# Patient Record
Sex: Male | Born: 1945 | Race: Black or African American | Hispanic: No | Marital: Single
Health system: Southern US, Community
[De-identification: ages and names within clinical notes are randomized; demographics above are authoritative.]

---

## 2012-02-05 ENCOUNTER — Inpatient Hospital Stay: Payer: Self-pay | Admitting: Internal Medicine

## 2012-02-05 LAB — BASIC METABOLIC PANEL WITH GFR
Anion Gap: 14
BUN: 55 mg/dL — ABNORMAL HIGH
Calcium, Total: 8.7 mg/dL
Chloride: 113 mmol/L — ABNORMAL HIGH
Co2: 15 mmol/L — ABNORMAL LOW
Creatinine: 6.55 mg/dL — ABNORMAL HIGH
EGFR (African American): 9 — ABNORMAL LOW
EGFR (Non-African Amer.): 8 — ABNORMAL LOW
Glucose: 106 mg/dL — ABNORMAL HIGH
Osmolality: 299
Potassium: 5 mmol/L
Sodium: 142 mmol/L

## 2012-02-05 LAB — URINALYSIS, COMPLETE
Bacteria: NONE SEEN
Bilirubin,UR: NEGATIVE
Glucose,UR: NEGATIVE mg/dL
Ketone: NEGATIVE
Leukocyte Esterase: NEGATIVE
Nitrite: NEGATIVE
Ph: 5
Protein: 100
RBC,UR: NONE SEEN /HPF
Specific Gravity: 1.009
Squamous Epithelial: <1
WBC UR: 1 /HPF

## 2012-02-05 LAB — CBC
HCT: 35 % — ABNORMAL LOW (ref 40.0–52.0)
HGB: 10.9 g/dL — ABNORMAL LOW (ref 13.0–18.0)
MCH: 29.6 pg (ref 26.0–34.0)
MCHC: 31.1 g/dL — ABNORMAL LOW (ref 32.0–36.0)
RDW: 13.9 % (ref 11.5–14.5)
WBC: 8 10*3/uL (ref 3.8–10.6)

## 2012-02-05 LAB — PHOSPHORUS: Phosphorus: 4.1 mg/dL (ref 2.5–4.9)

## 2012-02-05 LAB — HEMOGLOBIN A1C: Hemoglobin A1C: 4.6 %

## 2012-02-06 LAB — BASIC METABOLIC PANEL
Anion Gap: 9 (ref 7–16)
BUN: 51 mg/dL — ABNORMAL HIGH (ref 7–18)
Co2: 20 mmol/L — ABNORMAL LOW (ref 21–32)
Creatinine: 5.52 mg/dL — ABNORMAL HIGH (ref 0.60–1.30)
EGFR (African American): 12 — ABNORMAL LOW
EGFR (Non-African Amer.): 10 — ABNORMAL LOW
Potassium: 5.5 mmol/L — ABNORMAL HIGH (ref 3.5–5.1)
Sodium: 143 mmol/L (ref 136–145)

## 2012-02-06 LAB — CBC WITH DIFFERENTIAL/PLATELET
Eosinophil %: 2.1 %
HCT: 32.2 % — ABNORMAL LOW (ref 40.0–52.0)
HGB: 10.7 g/dL — ABNORMAL LOW (ref 13.0–18.0)
MCV: 92 fL (ref 80–100)
Monocyte %: 10.1 %
Neutrophil #: 4.2 10*3/uL (ref 1.4–6.5)
Neutrophil %: 52.8 %
Platelet: 245 10*3/uL (ref 150–440)
RBC: 3.49 10*6/uL — ABNORMAL LOW (ref 4.40–5.90)
WBC: 7.9 10*3/uL (ref 3.8–10.6)

## 2012-02-06 LAB — PHOSPHORUS: Phosphorus: 3.3 mg/dL (ref 2.5–4.9)

## 2012-02-07 LAB — BASIC METABOLIC PANEL
Anion Gap: 6 — ABNORMAL LOW (ref 7–16)
BUN: 43 mg/dL — ABNORMAL HIGH (ref 7–18)
Chloride: 110 mmol/L — ABNORMAL HIGH (ref 98–107)
Creatinine: 4.95 mg/dL — ABNORMAL HIGH (ref 0.60–1.30)
EGFR (African American): 13 — ABNORMAL LOW
Sodium: 141 mmol/L (ref 136–145)

## 2012-02-07 LAB — PHOSPHORUS: Phosphorus: 3.5 mg/dL (ref 2.5–4.9)

## 2012-02-09 LAB — RENAL FUNCTION PANEL
Albumin: 3.2 g/dL — ABNORMAL LOW (ref 3.4–5.0)
BUN: 43 mg/dL — ABNORMAL HIGH (ref 7–18)
EGFR (African American): 11 — ABNORMAL LOW
EGFR (Non-African Amer.): 9 — ABNORMAL LOW
Glucose: 107 mg/dL — ABNORMAL HIGH (ref 65–99)
Osmolality: 283 (ref 275–301)
Phosphorus: 4.5 mg/dL (ref 2.5–4.9)

## 2012-02-09 LAB — ALBUMIN: Albumin: 3.3 g/dL — ABNORMAL LOW (ref 3.4–5.0)

## 2012-02-10 LAB — BASIC METABOLIC PANEL
BUN: 35 mg/dL — ABNORMAL HIGH (ref 7–18)
Calcium, Total: 8.6 mg/dL (ref 8.5–10.1)
Chloride: 106 mmol/L (ref 98–107)
Co2: 27 mmol/L (ref 21–32)
Glucose: 92 mg/dL (ref 65–99)
Osmolality: 291 (ref 275–301)
Potassium: 4.7 mmol/L (ref 3.5–5.1)
Sodium: 142 mmol/L (ref 136–145)

## 2012-02-10 LAB — KAPPA/LAMBDA FREE LIGHT CHAINS (ARMC)

## 2012-02-10 LAB — PLATELET COUNT: Platelet: 191 10*3/uL (ref 150–440)

## 2012-03-07 ENCOUNTER — Ambulatory Visit: Payer: Self-pay | Admitting: Vascular Surgery

## 2012-03-27 ENCOUNTER — Emergency Department: Payer: Self-pay | Admitting: Emergency Medicine

## 2012-03-27 LAB — CBC
MCV: 90 fL (ref 80–100)
Platelet: 314 10*3/uL (ref 150–440)
RBC: 3.57 10*6/uL — ABNORMAL LOW (ref 4.40–5.90)
WBC: 9.6 10*3/uL (ref 3.8–10.6)

## 2012-03-27 LAB — CK TOTAL AND CKMB (NOT AT ARMC)
CK, Total: 174 U/L (ref 35–232)
CK-MB: 3 ng/mL (ref 0.5–3.6)

## 2012-03-27 LAB — BASIC METABOLIC PANEL
Anion Gap: 7 (ref 7–16)
BUN: 22 mg/dL — ABNORMAL HIGH (ref 7–18)
Chloride: 105 mmol/L (ref 98–107)
Co2: 25 mmol/L (ref 21–32)
Creatinine: 4.46 mg/dL — ABNORMAL HIGH (ref 0.60–1.30)
Glucose: 90 mg/dL (ref 65–99)
Osmolality: 277 (ref 275–301)
Potassium: 4.6 mmol/L (ref 3.5–5.1)
Sodium: 137 mmol/L (ref 136–145)

## 2012-03-27 LAB — TROPONIN I: Troponin-I: <0.02 ng/mL

## 2012-04-03 ENCOUNTER — Emergency Department: Payer: Self-pay | Admitting: Emergency Medicine

## 2012-04-03 LAB — URINALYSIS, COMPLETE
Glucose,UR: NEGATIVE mg/dL (ref 0–75)
Ketone: NEGATIVE
Squamous Epithelial: <1

## 2012-04-04 LAB — COMPREHENSIVE METABOLIC PANEL
BUN: 43 mg/dL — ABNORMAL HIGH (ref 7–18)
Calcium, Total: 8.3 mg/dL — ABNORMAL LOW (ref 8.5–10.1)
Chloride: 110 mmol/L — ABNORMAL HIGH (ref 98–107)
EGFR (African American): 8 — ABNORMAL LOW
EGFR (Non-African Amer.): 7 — ABNORMAL LOW
Glucose: 129 mg/dL — ABNORMAL HIGH (ref 65–99)
Osmolality: 292 (ref 275–301)
SGOT(AST): 27 U/L (ref 15–37)
SGPT (ALT): 25 U/L (ref 12–78)
Sodium: 140 mmol/L (ref 136–145)
Total Protein: 8.2 g/dL (ref 6.4–8.2)

## 2012-04-04 LAB — CBC
HCT: 33.5 % — ABNORMAL LOW (ref 40.0–52.0)
HGB: 10.6 g/dL — ABNORMAL LOW (ref 13.0–18.0)
MCH: 29.2 pg (ref 26.0–34.0)
MCHC: 31.7 g/dL — ABNORMAL LOW (ref 32.0–36.0)
RDW: 14.2 % (ref 11.5–14.5)

## 2012-04-04 LAB — ETHANOL
Ethanol %: 0.092 % — ABNORMAL HIGH (ref 0.000–0.080)
Ethanol: 92 mg/dL

## 2012-10-11 ENCOUNTER — Inpatient Hospital Stay: Payer: Self-pay | Admitting: Internal Medicine

## 2012-10-11 LAB — URINALYSIS, COMPLETE
Bilirubin,UR: NEGATIVE
Glucose,UR: NEGATIVE mg/dL (ref 0–75)
Ketone: NEGATIVE
Nitrite: NEGATIVE
Ph: 7 (ref 4.5–8.0)
Protein: 100
RBC,UR: <1 /HPF (ref 0–5)
Specific Gravity: 1.011 (ref 1.003–1.030)
Squamous Epithelial: <1

## 2012-10-11 LAB — COMPREHENSIVE METABOLIC PANEL
Anion Gap: 9 (ref 7–16)
BUN: 85 mg/dL — ABNORMAL HIGH (ref 7–18)
Bilirubin,Total: 0.3 mg/dL (ref 0.2–1.0)
Calcium, Total: 9.4 mg/dL (ref 8.5–10.1)
Creatinine: 8.32 mg/dL — ABNORMAL HIGH (ref 0.60–1.30)
EGFR (African American): 7 — ABNORMAL LOW
EGFR (Non-African Amer.): 6 — ABNORMAL LOW
Glucose: 81 mg/dL (ref 65–99)
Osmolality: 302 (ref 275–301)
SGOT(AST): 16 U/L (ref 15–37)
SGPT (ALT): 21 U/L (ref 12–78)
Sodium: 139 mmol/L (ref 136–145)
Total Protein: 8.9 g/dL — ABNORMAL HIGH (ref 6.4–8.2)

## 2012-10-11 LAB — POTASSIUM
Potassium: 6.8 mmol/L (ref 3.5–5.1)
Potassium: 4.8 mmol/L (ref 3.5–5.1)

## 2012-10-11 LAB — CBC
HCT: 38.2 % — ABNORMAL LOW (ref 40.0–52.0)
HGB: 12.6 g/dL — ABNORMAL LOW (ref 13.0–18.0)
MCHC: 32.9 g/dL (ref 32.0–36.0)
Platelet: 351 10*3/uL (ref 150–440)
RDW: 13.7 % (ref 11.5–14.5)
WBC: 8.4 10*3/uL (ref 3.8–10.6)

## 2012-10-11 LAB — PHOSPHORUS: Phosphorus: 3.2 mg/dL (ref 2.5–4.9)

## 2012-10-11 LAB — CK TOTAL AND CKMB (NOT AT ARMC)
CK, Total: 83 U/L (ref 35–232)
CK-MB: 2.6 ng/mL (ref 0.5–3.6)

## 2012-10-12 ENCOUNTER — Observation Stay: Payer: Self-pay | Admitting: Internal Medicine

## 2012-10-12 LAB — CBC WITH DIFFERENTIAL/PLATELET
Basophil #: 0 10*3/uL (ref 0.0–0.1)
Basophil %: 0.4 %
Eosinophil #: 0.2 10*3/uL (ref 0.0–0.7)
HCT: 38.4 % — ABNORMAL LOW (ref 40.0–52.0)
Lymphocyte %: 30.8 %
MCH: 30.3 pg (ref 26.0–34.0)
MCHC: 33.5 g/dL (ref 32.0–36.0)
MCV: 90 fL (ref 80–100)
Monocyte #: 0.9 x10 3/mm (ref 0.2–1.0)
Monocyte %: 9 %
Neutrophil %: 57.5 %
RBC: 4.26 10*6/uL — ABNORMAL LOW (ref 4.40–5.90)
RDW: 13.5 % (ref 11.5–14.5)

## 2012-10-12 LAB — COMPREHENSIVE METABOLIC PANEL
Alkaline Phosphatase: 143 U/L — ABNORMAL HIGH (ref 50–136)
Anion Gap: 7 (ref 7–16)
BUN: 35 mg/dL — ABNORMAL HIGH (ref 7–18)
Bilirubin,Total: 0.5 mg/dL (ref 0.2–1.0)
Co2: 30 mmol/L (ref 21–32)
Creatinine: 5.45 mg/dL — ABNORMAL HIGH (ref 0.60–1.30)
Osmolality: 283 (ref 275–301)
Potassium: 4 mmol/L (ref 3.5–5.1)
SGOT(AST): 29 U/L (ref 15–37)
Sodium: 134 mmol/L — ABNORMAL LOW (ref 136–145)
Total Protein: 8.9 g/dL — ABNORMAL HIGH (ref 6.4–8.2)

## 2012-10-12 LAB — BASIC METABOLIC PANEL
Anion Gap: 10 (ref 7–16)
Calcium, Total: 8.5 mg/dL (ref 8.5–10.1)
Chloride: 103 mmol/L (ref 98–107)
Co2: 24 mmol/L (ref 21–32)
Creatinine: 6.84 mg/dL — ABNORMAL HIGH (ref 0.60–1.30)
EGFR (African American): 9 — ABNORMAL LOW
Osmolality: 290 (ref 275–301)
Potassium: 5.3 mmol/L — ABNORMAL HIGH (ref 3.5–5.1)

## 2012-10-12 LAB — CBC
HGB: 12.7 g/dL — ABNORMAL LOW (ref 13.0–18.0)
MCH: 29.8 pg (ref 26.0–34.0)
MCHC: 33.4 g/dL (ref 32.0–36.0)
MCV: 89 fL (ref 80–100)
RBC: 4.25 10*6/uL — ABNORMAL LOW (ref 4.40–5.90)
WBC: 10.5 10*3/uL (ref 3.8–10.6)

## 2012-10-12 LAB — TROPONIN I: Troponin-I: 0.02 ng/mL

## 2012-10-12 LAB — PHOSPHORUS: Phosphorus: 4.9 mg/dL (ref 2.5–4.9)

## 2012-10-13 LAB — BASIC METABOLIC PANEL
BUN: 38 mg/dL — ABNORMAL HIGH (ref 7–18)
Calcium, Total: 8.4 mg/dL — ABNORMAL LOW (ref 8.5–10.1)
Co2: 29 mmol/L (ref 21–32)
EGFR (African American): 11 — ABNORMAL LOW
EGFR (Non-African Amer.): 9 — ABNORMAL LOW
Glucose: 127 mg/dL — ABNORMAL HIGH (ref 65–99)
Osmolality: 286 (ref 275–301)
Potassium: 4.6 mmol/L (ref 3.5–5.1)
Sodium: 138 mmol/L (ref 136–145)

## 2012-10-13 LAB — CK-MB
CK-MB: 3.3 ng/mL (ref 0.5–3.6)
CK-MB: 3.4 ng/mL (ref 0.5–3.6)
CK-MB: 3.2 ng/mL (ref 0.5–3.6)

## 2012-10-13 LAB — TROPONIN I
Troponin-I: 0.04 ng/mL
Troponin-I: 0.03 ng/mL

## 2012-10-13 LAB — CK
CK, Total: 170 U/L (ref 35–232)
CK, Total: 186 U/L (ref 35–232)
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
## 2014-07-23 NOTE — Op Note (Signed)
PATIENT NAME:  Dakota Robertson, Dakota Robertson MR#:  [id number] DATE OF BIRTH:  01/27/1946  DATE OF PROCEDURE:  02/05/2012  PREOPERATIVE DIAGNOSIS: Chronic kidney disease with progression and need for dialysis today.   POSTOPERATIVE DIAGNOSIS: Chronic kidney disease with progression and need for dialysis today.       PROCEDURES:    1. Ultrasound guidance for vascular access, right femoral vein.  2. Placement of right femoral venous dialysis catheter.   SURGEON: Annice NeedyJason S. Dew, M.D.   ANESTHESIA: Local.   ESTIMATED BLOOD LOSS: Minimal.   INDICATION FOR PROCEDURE: This is a 69 year old male with recent diagnosis of severe chronic kidney disease. He is admitted with progression and need for dialysis at this time.   DESCRIPTION OF PROCEDURE: The patient's groin was sterilely prepped and draped, and a sterile surgical created. The right femoral vein was visualized with ultrasound and found to be widely patent. It was then accessed under direct ultrasound guidance without difficulty. After the femoral vein was accessed with a Seldinger needle under direct ultrasound guidance, a J-wire was placed. After skin nick and dilatation, the Trialysis type 30 cm long dialysis catheter was placed over the wire and the wire was removed. All three lumens withdrew dark red nonpulsatile blood and flushed easily with sterile saline. It was secured at the skin at 29 cm with three nylon sutures. A sterile dressing was placed.   ____________________________ Annice NeedyJason S. Dew, MD jsd:ap D: 02/05/2012 13:33:46 ET          T: 02/05/2012 14:14:06 ET JOB#: [id number] cc: Annice NeedyJason S. Dew, MD, <Dictator> Annice NeedyJASON S DEW MD ELECTRONICALLY SIGNED 02/07/2012 13:14

## 2014-07-23 NOTE — Consult Note (Signed)
Patient with CKD, appears to have progressed to ESRD but not entirely clear.  Does need HD today so will place a temporary catheter.  If does not recover over next couple of days and is clearly ESRD, will place Permcath early this week  Electronic Signatures: Annice Needyew, Jason S (MD)  (Signed on 02-Nov-13 12:45)  Authored  Last Updated: 02-Nov-13 12:45 by Annice Needyew, Jason S (MD)

## 2014-07-23 NOTE — Consult Note (Signed)
PATIENT NAME:  Dakota Robertson, Dakota Robertson MR#:  [id number] DATE OF BIRTH:  01/27/1946  DATE OF CONSULTATION:  02/08/2012  REFERRING PHYSICIAN:   CONSULTING PHYSICIAN:  Audery Amel, MD  IDENTIFYING INFORMATION AND REASON FOR CONSULT: This is a 69 year old man who was brought to the Emergency Room for a complaint of alcohol abuse, brought in by police with "need for medical clearance for placement in jail".  In the Emergency Room, the patient was discovered to be in worsening renal failure and in need of admission for dialysis. Consultation appears to have been triggered by agitated and uncooperative behavior at dialysis yesterday.   HISTORY OF PRESENT ILLNESS: Information obtained from the patient and the chart. His  chief complaint to me was, "It's the same damn thing- I was drinking too much." He tells me that he has been drinking about a pint of liquor a day and has been doing so for years. He currently resides at a group home and said that he has to sneak to do it and usually does it on his own. He admits that he does not enjoy it a great deal, but says that he does not think that it has been a significant problem. He tells me that this is actually the first time in his life that drinking had been a problem for him. He is not able to articulate for me specifics about his medical treatment here in the hospital. He tells me several times that he knows there is something wrong with his liver. He remembers having been agitated at dialysis yesterday. His explanation for it to me is that he was having pain in his groin and he does not think that the doctors were paying any attention to him. He does not remember any behavior problems in the Emergency Room. It was described in the admission note that he was agitated and required security management when he was brought into the Emergency Room. The patient's chart on a couple of occasions mentions him as having had a past history of a diagnosis of schizophrenia. There are  not any other details offered about that and he does not appear to be on any antipsychotic medicines. I asked him about this and he denied anyone ever having told him that he had schizophrenia or having any history of mental illness.   PAST PSYCHIATRIC HISTORY: The only information I have is from the patient. He says he has never been in a psychiatric hospital. Never been in any kind of detox facility. He says he has never had delirium tremens. Never had a seizure from alcohol withdrawal. He admits that he drinks a pint of liquor a day but minimizes the degree to which that has ever been a problem. Denies that he is abusing any other drugs. Denies ever having been treated with any other psychiatric medicine.   SOCIAL HISTORY: Evidently he is currently living in a group home locally. He says he has been there for about the last four months. Prior to that he is a little bit vague about his lifestyle and whereabouts. He tells me that he was placed in the group home from [facility] where he was treated earlier in the year. Prior to that he says that he moved around a lot and has not been able to work in a few years because of pain in his leg. He does not appear to have any family actively involved in his assistance right now.   He does apparently have outstanding warrants because  he was brought into the Emergency Room by the police with the concern that they wanted to have him cleared in order to go into jail.   PAST MEDICAL HISTORY: He has chronic kidney disease, hypertension, possible diabetes, history of alcohol abuse, chronic obstructive pulmonary disease, listed as possibly having schizophrenia but no details were given. On admission his medications were listed as being trazodone, tramadol, metoprolol, cetirizine, amlodipine and sodium bicarbonate.   FAMILY HISTORY: Family history of alcohol abuse.   REVIEW OF SYSTEMS: He complains of some pain in his groin around the dialysis catheter. Denies  feeling shaky. Denies feeling sick to his stomach. Denies feeling confused. Denies any acute mood symptoms. Denies any hostility or suicidal ideation.   MENTAL STATUS EXAM: The patient was awake and alert and cooperative throughout the interview. He made intermittent eye contact but part of that was because he was eating dinner while I was there. Once he was done he made pretty good eye contact. Psychomotor activity appeared to be appropriate. There was no evidence of tremor. There was no evidence of greatly confused or disorganized movements such as might be seen in a delirious person. His speech was normal rate, tone, and volume. His affect was a bit blunted but not bizarre. Mood was stated as being okay. Thoughts showed a little bit of slowness, but no sign of completely bizarre thinking. It is  possible he may be confabulating or simply being evasive in not mentioning his criminal charges or anything else about his history of alcohol abuse. He denies any suicidal or homicidal ideation. Denies any hallucinations, visual or auditory.  He did not otherwise show any signs of being psychotic. The patient clearly has an impaired memory. I did not do a full Mini-Mental Status exam with him, but he was not, for instance, able to tell me the correct year. Neither did he correct himself spontaneously. He told me several times that he was here for problems with his liver. He could not explain to me what the dialysis catheter was for. When I explained to him very simply that it had to do with his kidney disease and how dialysis basically worked and then asked him a minute later to repeat it, he once again told me about his liver and had no memory of our discussion of his kidneys.   ASSESSMENT: This is a 69 year old man who apparently was agitated in dialysis yesterday. Without seeing the episode myself it is not clear whether he was delirious or may have actually just been reacting to the pain and not been able to  articulate it well or what was going on. In my interview today he does not appear to be psychotic, but he does appear to have some degree of dementia. His dementia could certainly be explained by his alcohol abuse. I suspect that he is probably not being completely honest about the degree and problems that he has had related to his alcohol abuse. Right now, however, he does not appear to have delirium tremens. He is not shaky. He is not hemodynamically unstable in a manner typical of alcohol withdrawal.   TREATMENT PLAN: I tried to offer supportive and educational therapy with the patient. I tried  to do some exploration of his alcohol abuse problems. He is unwilling to admit that he has a significant problem and has no interest in going into an alcohol treatment program. At this point, I do not think he needs CIWA protocol or full medical management for alcohol withdrawal.  To manage the agitation, I suggest a small dose of benzodiazepine prior to dialysis. I put in an order for Xanax dose prior to his dialysis treatment tomorrow. I will continue to follow up while he is in the hospital. Fortunately, he is already living in a group home. I hope they will be willing to take him back, although it may be that he is going to jail anyway. Care management should probably be involved in assessing exactly what his legal and social situation is.   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   SECONDARY DIAGNOSES:  AXIS I: Alcohol dependence.   AXIS II: Deferred.   AXIS III: Renal failure, now getting hemodialysis, hypertension, alcohol abuse.   AXIS IV: Moderate from his minimal resources and possible legal problems.   AXIS V: Functioning at time of evaluation: 40.   ____________________________ Audery Amel, MD jtc:bjt D:  02/08/2012 22:23:39 ET          T: 02/09/2012 09:08:58 ET         JOB#: [id number]   Audery Amel MD ELECTRONICALLY SIGNED  02/09/2012 10:05

## 2014-07-23 NOTE — Op Note (Signed)
PATIENT NAME:  Dakota Robertson, Dakota Robertson MR#:  [id number] DATE OF BIRTH:  1946-03-01  DATE OF PROCEDURE:  02/09/2012  PREOPERATIVE DIAGNOSES:  1. Endstage renal disease.  2. Diabetes.  3. Hypertension.   POSTOPERATIVE DIAGNOSES:  1. Endstage renal disease.  2. Diabetes.  3. Hypertension.   PROCEDURES PERFORMED: 1. Ultrasound guidance for vascular access, right jugular vein.  2. Fluoroscopic guidance for placement of catheter. 3. Placement of 19 cm tip-to-cuff tunneled hemodialysis catheter.   SURGEON: Annice NeedyJason S. Dew, M.D.   ANESTHESIA: Local with moderate conscious sedation.   ESTIMATED BLOOD LOSS: Approximately 25 mL.  FLUOROSCOPY TIME: Less than 1 minute.  CONTRAST USED: None.  INDICATION FOR PROCEDURE: This is a 69 year old African American male with end-stage renal disease who now needs permanent dialysis access to be able to be discharged home with. He will require a fistula graft later as an outpatient.   DESCRIPTION OF PROCEDURE: The patient is brought to the vascular interventional radiology suite. The right neck and chest was sterilely prepped and draped and a sterile surgical field was created. The right jugular vein was visualized with ultrasound and found to be widely patent. It was then accessed under direct ultrasound guidance without difficulty with a Seldinger needle. J-wire was placed. After skin nick and dilatation, the peel-away sheath was placed over the wire and the wire and dilator were removed. I then anesthetized an area two finger-breaths below the right clavicle and tunneled from the subclavicular incision to the access site. Using fluoroscopic guidance, I selected a 19 cm tip-to-cuff tunneled hemodialysis catheter and tunneled this from the subclavicular incision to the access site, placed this through the peel-away sheath, and the peel-away sheath was removed. The catheter tip was located just into the right atrium, with the distal tip without kink. It withdrew blood well  and flushed easily with heparinized saline and concentrated heparin solution was placed. It was secured to the chest wall with two silk sutures. A 4-0 Monocryl pursestring suture was placed at the exit site and a 4-0 Monocryl pursestring suture was placed at the access site. Sterile dressing was placed. The patient tolerated the procedure well.  ____________________________ Annice NeedyJason S. Dew, MD jsd:slb D: 02/09/2012 17:29:20 ET T: 02/10/2012 10:12:38 ET JOB#: [id number]  cc: Annice NeedyJason S. Dew, MD, <Dictator> Annice NeedyJASON S DEW MD ELECTRONICALLY SIGNED 02/10/2012 18:12

## 2014-07-23 NOTE — Consult Note (Signed)
Details:    - Psychiatry: Saw patient prior to discharge. He was calm and appropriate. No sign of delirium. Understood the dialysis and was agreeable to further treatment. Counceling done with him about his alcohol use and how further use put him at elevated risk to die sooner and how important it was to remain sober. Patient states his understanding and agrees to stay off alcohol. Denies any suicidal ideation   Electronic Signatures: Audery Amellapacs, Nehan Flaum T (MD)  (Signed [phone]-Nov-13 15:04)  Authored: Details   Last Updated: 08-Nov-13 15:04 by Audery Amellapacs, Brittannie Tawney T (MD)

## 2014-07-23 NOTE — Discharge Summary (Signed)
PATIENT NAME:  Dakota Robertson, Dakota Robertson MR#:  [id number] DATE OF BIRTH:  03/01/1946  DATE OF ADMISSION:  02/05/2012 DATE OF DISCHARGE:  02/10/2012  PRIMARY CARE PHYSICIAN: [facility]  FINAL DIAGNOSES:  1. End-stage renal disease requiring dialysis.  2. Encephalopathy.  3. Hypertension.  4. Alcohol abuse.  5. Tobacco abuse.   MEDICATIONS ON DISCHARGE:  1. Amlodipine 10 mg half tablet daily. 2. Cetirizine 10 mg daily.  3. Metoprolol 25 mg twice a day. 4. Tramadol 50 mg every six hours as needed.  5. Trazodone 50 mg at bedtime.  6. Vitamin B 100 mg daily.  7. Acetaminophen 325 mg two tablets twice a day as needed for headache.  8. Nicotine patch 21 mg to chest wall, extended-release, one patch transdermally daily.   NOTE: Stop taking sodium bicarbonate.   DIET: Renal diet, diet consistency regular.  ACTIVITY: Activity as tolerated.   DISCHARGE FOLLOWUP: Followup with dialysis Tuesday, Thursday, and Saturday at 11:30 a.m. starting next Tuesday; [facility] is the [facility] on [address]. Followup in 1 to 2 weeks with your medical doctor.   REASON FOR ADMISSION: The patient was admitted 02/05/2012 and discharged 02/10/2012. The patient was brought in with altered mental status.   HISTORY OF PRESENT ILLNESS: The patient is a 69 year old man with history of alcohol abuse [facility] with two outstanding warrants and needed medical clearance prior to placement in the jail. He was initially refusing to answer questions in the Emergency Room. He was found to have a very elevated creatinine of 6.5 and a BUN of 55 and nephrology recommended dialysis. He was admitted to the hospital and started on dialysis. Dr. Wyn Quakerew placed a temporary catheter on 02/05/2012 and then placed a tunnelled catheter on 02/09/2012, in the right upper chest.   LABORATORY, DIAGNOSTIC AND RADIOLOGIC DATA: Ethanol level 111, glucose 106, BUN 55, creatinine 6.55, sodium 142,  potassium 5.0, chloride 113, CO2 15, and calcium 8.7.   White blood cell count 8.0, hemoglobin and hematocrit 10.9 and 35.0, and platelet count 265.  Urinalysis positive for 1+ blood, otherwise negative urinalysis.  CT scan of the head showed no acute intracranial abnormality, atrophy with chronic microvascular ischemic change.   EKG showed normal sinus rhythm, left axis deviation, incomplete right bundle branch block.   Hemoglobin A1c 4.6.   Parathyroid hormone 169.  Hepatitis B surface antigen negative.   Ultrasound of the kidneys showed increased echotexture in the renal parenchyma consistent with mild chronic renal disease; no mass or obstructions. No stones are evident. Prostate is diffusely enlarged.   A repeat chemistry was done after tunneled catheter on 02/09/2012, in the evening, and potassium came back at 7.7 and that required urgent dialysis. Potassium after dialysis was 4.5.   Hepatitis C antibody was negative. Free kappa/lambda - free kappa light chains elevated at 185.46, free lambda light chains elevated at 64.96. Kappa/lambda ratio elevated at 2.85.   Chemistry with dialysis on 02/10/2012 showed a potassium of 4.7, creatinine of 4.94, and platelet count of 191.   HOSPITAL COURSE PER PROBLEM LIST:  1. For the patient's end-stage renal disease requiring dialysis with hyperkalemia, the patient was started on dialysis. A temporary catheter was placed and that was removed and a right chest tunnelled catheter was placed by Dr. Wyn Quakerew. The patient will continue to follow up with dialysis as outpatient Tuesday, Thursday and Saturday, starting next Tuesday. This was okayed by Dr. Thedore MinsSingh. The patient will be able to miss  dialysis on Saturday; new starts are not started on Saturday.  2. Encephalopathy. The patient does get confused after waking up from naps. Psychiatry did see him when he was awake and alert and no medications were prescribed. The patient has had periods of confusion. He  was on CIWA protocol. During the hospitalization no signs of delirium tremens. I think the alcohol has affected him. He does have chronic microvascular disease on the CT scan of the head.  3. Hypertension. He is on amlodipine and metoprolol; blood pressure 125/80.  4. Alcohol abuse. I advised him to stop drinking alcohol. I think that this is his major issue.  5. Tobacco abuse. Smoking cessation counseling done, three minutes by me. Nicotine patch applied.   TIME SPENT ON DISCHARGE: 35 minutes.  ____________________________ Herschell Dimes. Renae Gloss, MD rjw:slb D: 02/10/2012 15:17:33 ET T: 02/11/2012 11:34:11 ET JOB#: [id number]  cc: Herschell Dimes. Renae Gloss, MD, <Dictator> Munsoor Lizabeth Leyden, MD Mosetta Pigeon, MD [facility] - [address] Salley Scarlet MD ELECTRONICALLY SIGNED 02/12/2012 20:20

## 2014-07-23 NOTE — Consult Note (Signed)
Details:    - Psychiatry: Came to see patient but he was off getting his perm-cath placed. will follow tomorrow.   Electronic Signatures: Audery Amellapacs, Tamikka Pilger T (MD)  (Signed [phone]-Nov-13 16:10)  Authored: Details   Last Updated: 07-Nov-13 16:10 by Audery Amellapacs, Thadd Apuzzo T (MD)

## 2014-07-23 NOTE — Consult Note (Signed)
Brief Consult Note: Diagnosis: alcohol abuse. dementia.   Patient was seen by consultant.   Consult note dictated.   Recommend to proceed with surgery or procedure.   Comments: Psychiatry: Patient seen. Was apparently agitated during dialysis. He admits he has been drinking about a pint of liquor a day recently. He has no history of DTs or seizures. He is currently calm and pleasant and cooperative and not delirious but does have some short term memory problems. Chart says"schizophrenia" but he denies it and does not have symptoms of schizophrenia. Would not change or add to treatment at this time. Will follow.  Electronic Signatures: Audery Amellapacs, John T (MD)  (Signed [phone]-Nov-13 17:58)  Authored: Brief Consult Note   Last Updated: 05-Nov-13 17:58 by Audery Amellapacs, John T (MD)

## 2014-07-23 NOTE — Op Note (Signed)
PATIENT NAME:  [name], [name] MR#:  Robertson DATE OF BIRTH:  [birth year]  DATE OF PROCEDURE:  03/07/2012  PREOPERATIVE DIAGNOSIS: Complication dialysis catheter with poor flows and inability to achieve dialysis.   POSTOPERATIVE DIAGNOSIS: Complication dialysis catheter with poor flows and inability to achieve dialysis.   PROCEDURE PERFORMED: TPA infusion for catheter clearance.   SURGEON: Renford DillsGregory G. Allyse Fregeau, MD   DESCRIPTION OF PROCEDURE: The patient is brought to Special Procedures holding area. 2 mg of TPA in 50 mL x2 is reconstituted. It is then infused to be at both lumens of the catheter simultaneously for approximately three hours. Following this, aspiration of both lumens by myself demonstrated excellent blood flow. Both lumens were flushed with sterile saline and then packed with 5000 units of heparin per lumen. The patient tolerated the procedure well. There were no complications.    ____________________________ Renford DillsGregory G. Danylle Ouk, MD ggs:drc D: 03/07/2012 16:44:33 ET T: 03/07/2012 16:59:20 ET JOB#: [id number]  cc: Renford DillsGregory G. Nathanal Hermiz, MD, <Dictator> Renford DillsGREGORY G Tuwanna Krausz MD ELECTRONICALLY SIGNED 03/22/2012 16:23
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
## 2014-07-26 NOTE — Discharge Summary (Signed)
PATIENT NAME:  Dakota HawthornFAUST, [name] MR#:  [id number] DATE OF BIRTH:  Aug 01, 1945  DATE OF ADMISSION:  10/11/2012 DATE OF DISCHARGE:  10/12/2012  ADMITTING DIAGNOSES:  1. Admitted for hemodialysis, severe hypokalemia of 7.9 with EKG changes.  2. Accelerated hypertension.   DISCHARGE DIAGNOSES:  1. The patient requiring hemodialysis due to recently being incarcerated.  2. Accelerated hypertension.  3. Severe hyperkalemia due to missing dialysis.  4. End-stage renal disease.  5. Accelerated hypertension.  6. Schizophrenia.  7. Previous history of shingles.  8. Previous history of alcohol abuse.  9. History of tobacco abuse.  10. History of chronic obstructive pulmonary disease.   CONSULTANTS: Nephrology.   PERTINENT LABORATORIES AND EVALUATIONS: Magnesium on admission 2.7. Urinalysis was negative for nitrites, leukocytes. BMP: Glucose was 81 on presentation, BUN 85, creatinine 8.32, sodium 139, potassium 7.9, chloride 113, CO2 was 17, calcium 9.4, alkaline phosphatase was 146, total protein 8.9, albumin 3.2. EKG: Normal sinus rhythm with left anterior fascicular block, incomplete right bundle branch block. Subsequent potassium on July 10th was 5.3, after hemodialysis was 4.0.   HOSPITAL COURSE: Please refer to H and P done by the admitting physician. The patient is a 69 year old with end-stage renal disease, on hemodialysis Tuesday, Thursday, Saturday. He missed one of his dialysis days because he was incarcerated. The [place], but they do not have EMLA cream, so he requested to bring him to dialysis here. The patient in the ED was noted to have potassium of 7.9, and urgently nephrology was called, and the patient underwent emergent dialysis. He again had 2 more episodes of dialysis. The patient was released on bond. Therefore, he is being released home. Therefore, he will follow up with his regular dialysis schedule. The patient  also was noted to have severe hyperkalemia that resolved with dialysis. He also had accelerated hypertension, which also improved with hemodialysis. At this time, he has had a total of 2 dialyses and is doing well and is stable for discharge.   DISCHARGE MEDICATIONS:  1. Metoprolol tartrate 12.5 b.i.d. 2. Flomax 0.4 daily.  3. Sensipar 30 mg daily. 4. Tums E-X 750 one tab p.o. b.i.d. 5. Trazodone 50 daily.  6. Tramadol 50 q.6 p.r.n. for pain.  7. Norco 325/5 one tab p.o. q.8 p.r.n. pain.   DIET: Renal.   ACTIVITY: As tolerated.   FOLLOWUP: With primary MD in 1 to 2 weeks. Resume hemodialysis as doing previously.   NOTE: 32 minutes spent on the discharge.  ____________________________ Lacie ScottsShreyang H. Allena KatzPatel, MD shp:OSi D: 10/13/2012 07:56:40 ET T: 10/13/2012 09:38:24 ET JOB#: [id number]  cc: Julaine Zimny H. Allena KatzPatel, MD, <Dictator> Charise CarwinSHREYANG H Alessander Sikorski MD ELECTRONICALLY SIGNED 10/17/2012 11:02

## 2014-07-26 NOTE — Discharge Summary (Signed)
PATIENT NAME:  Dakota Robertson, Dakota Robertson#:  [id number] DATE OF BIRTH:  [birth year]  DATE OF ADMISSION:  10/12/2012 DATE OF DISCHARGE:  07/11/201407/03/2013  REASON OF ADMISSION:  Near syncopal episode associated to  hypotension.   DISCHARGE DIAGNOSES: 1. Orthostatic hypotension, hypotension related to dialysis, end-stage renal disease, alcohol abuse, hypertension, history of previous tuberculosis.  2.  Chronic pain.  3.  Secondary hyperparathyroidism.  4.  Tobacco abuse.  5.  Alcohol abuse.  6.  Chronic disease anemia.   DISPOSITION: Home. The patient does not have established house.  He lives in a hotel right now.    CURRENT MEDICATIONS:  1.  Docusate 100 mg b.i.d. p.r.n. 2.  Lidocaine with prilocaine 2.5 cream to apply on fistula. 3.  Tramadol 50 mg q. 6 hours p.r.n. pain.  4.  Zolpidem 5 mg at bedtime. 5.  Tums EX 750 mg as needed for indigestion.  6.  Tamsulosin 0.4 mg at bedtime. 7.  Sensipar 30 mg once a day. 8.  Norco 325 mg/5 mg 1 every 8 hours as needed for pain. 9.  Metoprolol 0.5 mg twice daily. This medication was put on hold due to his hypotension.   IMPORTANT RESULTS: Patient had a potassium of 7.99 on previous admission on the 9th.  The patient was discharged on the 10th and then readmitted the same day on the 10th.  At the moment of readmission on the 10th, potassium is 5.3, glucose was 91, creatinine 6.48, sodium 137, alkaline phosphatase 143 and troponins were negative x3. White blood cells 10.5, hemoglobin 12.7, and platelets were within normal limits.  HOSPITAL COURSE: Dakota Robertson is a 69 year old African American male who has a history of being admitted on the 9th, discharged on 10/12/2012 as he was found to have severe hyperkalemia. The patient apparently underwent dialysis that day on the 9th and also on the 10th to lower his potassium. He was discharged and went to the train station to go to Dakota Robertson. At that moment, the patient felt really tired, dizzy and lightheaded,  had a near syncopal episode and bystanders called EMS.  The patient was brought to the Emergency Department  and at that moment his systolic blood pressure was around the 80s. He received IV fluid boluses and his blood pressure started to improve to 115/70 with a heart rate in the 90s.   The patient was not able to be discharged and sent home due to his transportation problems.   The patient spent the night in the hospital.  The following day he was still significantly hypotensive. Orthostatic hypotension with a drop of his systolic blood pressure of more than 20 points from 113 to 93.  The patient was started on midodrine twice daily as he kept complaining of new syncopal episodes and orthostatic hypotension especially during dialysis, although it happens as well on nondialysis days.   There have been some issues about arranging transportation in arranging the discharge for this patient for which I stayed up until the 12th.  There have been arrangements made with Dakota Robertson so he can dialyze this patient early in the morning on the discharge day so we can get him by 1 p.m. to the train station to get his train.  The patient did well during this hospitalization. There was no other issues surrounding this hospitalization.   I spent about 45 minutes with this patient on the 11th.     ____________________________ Dakota Furnaceoberto Sanchez Gutierrez, Robertson rsg:dp D: 10/14/2012 06:55:00 ET T: 10/14/2012 11:10:50 ET JOB#:  [id number]  cc: Dakota Furnace, Robertson, <Dictator> Dakota Robertson ELECTRONICALLY SIGNED 10/15/2012 13:29
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
## 2014-07-26 NOTE — H&P (Signed)
PATIENT NAME:  Dakota Robertson, Dakota Robertson MR#:  [id number] DATE OF BIRTH:  03-11-1946  DATE OF ADMISSION:  10/11/2012  ADDENDUM  Medication list is now available.  MEDICATIONS:   1.  Metoprolol 25 mg 1/2 tablet p.o. b.i.d.  2.  Norco, 5/325, 1 tablet every 8 hours as needed for pain.  3.  Sensipar 30 mg p.o. daily.  4.  Tamsulosin 0.4 mg daily.  5.  Tramadol 50 mg p.o. q.6 hours p.r.n. for pain.  6.  Trazodone 50 mg p.o. daily.  7.  TUMS 1 tablet every 2 hours as needed for indigestion.   We are going to restart him on Sensipar, tamsulosin and trazodone.    ____________________________ Katha HammingSnehalatha Jacelynn Hayton, MD sk:jm D: 10/11/2012 15:23:58 ET T: 10/11/2012 15:50:45 ET JOB#: [id number]  cc: Katha HammingSnehalatha Beanca Kiester, MD, <Dictator> Katha HammingSNEHALATHA Roshon Duell MD ELECTRONICALLY SIGNED 10/26/2012 8:21

## 2014-07-26 NOTE — H&P (Signed)
PATIENT NAME:  Dakota Robertson, [name] MR#:  [id number] DATE OF BIRTH:  09/01/1945  DATE OF ADMISSION:  10/12/2012  [place] [facility]  [place]  CHIEF COMPLAINT: Near-syncopal episode.   HISTORY OF PRESENT ILLNESS:  Dakota Robertson is a 69 year old African-American gentleman, who was admitted July 9 and discharged July 10 after he was found to have hyperkalemia. Underwent hemodialysis session, which was done yesterday, which was July 9, and an additional one done today, that is July 10, since his potassium was 5.3. After hemodialysis, patient was discharged. [place] went [place] train [place]; however, he felt really hot, dizzy, lightheaded, and had a near-syncopal episode. Bystanders called EMS, patient was brought here to the Emergency Room, was found to have blood pressure systolic in the 80s. He received IV fluids. His blood pressure during my evaluation is 115/70. The patient's heart rate is 90 to 91. He is not in any respiratory distress. The patient was feeling well, and was agreeable to go home. However, he had no mode of transportation and we could not provide any taxi voucher, since [place]. He is being admitted for further evaluation and management.   PAST MEDICAL HISTORY: 1.  End-stage renal disease, on hemodialysis Tuesday, Thursday and [place].  2.  Alcohol abuse.  3.  Hypertension.  4.  History of TB.  CURRENT MEDICATIONS: 1.  TUMS EX 750 mg b.i.d.  2.  Trazodone 50 mg p.o. daily.  3.  Tramadol 50 mg every 6 hours as needed.  4.  Tamsulosin 0.4 mg daily.  5.  Sensipar 30 mg daily.  6.  Norco 5/325, one every 8 hours as needed.  7.  Metoprolol 25 mg 1/2 tablet b.i.d.   ALLERGIES:  ASPIRIN.   SOCIAL HISTORY:  [place]. He smokes about a pack a day. No drugs. He was using marijuana before.   FAMILY HISTORY:  CKD in mother and  father.   REVIEW OF SYSTEMS:    CONSTITUTIONAL:  Positive for fatigue and weakness. No fever.  EYES:  No blurred or double vision or glaucoma.  EARS, NOSE, THROAT:  No tinnitus, ear pain, hearing loss.  RESPIRATORY:  No cough, wheeze, hemoptysis.  CARDIOVASCULAR:  No chest pain, orthopnea, edema or arrhythmia.  GASTROINTESTINAL:  No nausea, vomiting, diarrhea, abdominal pain or GERD.  GENITOURINARY:  No dysuria or hematuria.  ENDOCRINE:  No polyuria, nocturia or thyroid problems.  HEMATOLOGY:  Positive for chronic anemia. No easy bruising or bleeding.  SKIN:  No acne or rash.  MUSCULOSKELETAL:  Positive for arthritis. No swelling of the joint.  NEUROLOGIC:  No CVA, TIA, ataxia, vertigo or numbness.  PSYCHIATRIC:  No anxiety or depression.  All other systems reviewed are negative.   PHYSICAL EXAMINATION: GENERAL:  The patient is awake, alert, oriented x 3, not in acute distress.  VITAL SIGNS:  Afebrile, pulse is 93, blood pressure is 91/61, repeat blood pressure was 115/70, sats are 95% on room air.  HEENT:  Atraumatic, normocephalic. PERLA. EOM intact. Oral mucosa is moist.  NECK:  Supple. No JVD. No carotid bruit.  RESPIRATORY:  Clear to auscultation bilaterally. No rales, rhonchi, respiratory distress or labored breathing.  CARDIOVASCULAR:  Both the heart sounds are normal. Rate, rhythm regular. PMI not lateralized. Chest nontender. Good pedal pulses, good femoral pulses. No lower extremity edema.  ABDOMEN:  Soft, benign, nontender. No organomegaly. Positive  bowel sounds.  NEUROLOGIC:  Grossly intact cranial nerves II through XII. No motor or sensory deficit.  PSYCHIATRIC: The patient is awake, alert, oriented x 3.  DATA:  Chest x-ray shows heart and mediastinum stable. There is focal opacity in the left lower lobe, could be secondary to atelectasis. White count is 10.5, H and H is 12.7 and 37.9. Troponin is 0.02. BUN is 35, sodium is 134, potassium is 4.0, chloride is 97, phosphorus  is 4.9.   ASSESSMENT: A 69 year old Dakota Robertson with history of end-stage renal disease on hemodialysis, comes to the Emergency Room from the train station after he started feeling dizzy, lightheaded. He is being admitted with:   1.  Near-syncopal episode, suspected most likely due to combination of hemodialysis sessions 2 sessions in a row, along with beta blockers and lack of sleep. The patient felt dizzy, lightheaded, was found somewhat hypotensive with systolic in the 80s. He received a liter of IV fluids in the Emergency Room. Blood pressure came up to 115 systolic. The patient is asymptomatic. EKG does not show any acute changes. He will be admitted for overnight observation. Will hold off on prescribing any blood pressure medications at this time.   2.  End-stage renal disease, on hemodialysis. The patient received a session of hemodialysis on July 9 in the evening and July 10 in the morning. His potassium is normal to 4.0. He does not have any volume overload. He will resume his hemodialysis once he returns back to [facility] after discharge tomorrow, if he is stable, starting Saturday.   3.  Chronic anemia. H and H appear stable. Appears to be due to end-stage renal disease.   4.  Tobacco abuse. Advised cessation. About 3 to 4 minutes spent counseling. The patient is not keen on smoking cessation.   5.  No family members present. The patient is a FULL CODE.   TIME SPENT:  50 minutes.    ____________________________ Wylie Hail Allena Katz, MD sap:mr D: 10/12/2012 19:59:40 ET T: 10/12/2012 20:40:16 ET JOB#: [id number]  cc: Elliett Guarisco A. Allena Katz, MD, <Dictator> Willow Ora MD ELECTRONICALLY SIGNED 10/23/2012 19:02

## 2014-07-26 NOTE — Discharge Summary (Signed)
PATIENT NAME:  Dakota HawthornFAUST, Efstathios W MR#:  [id number] DATE OF BIRTH:  Mar 05, 1946  DATE OF ADMISSION:  10/12/2012 DATE OF DISCHARGE:  10/14/2012  PR[place]   CHIEF COMPLAINT: Hypotension.   DISCHARGE DIAGNOSES: 1.  Orthostatic hypotension, improved.  2.  End-stage renal disease, on hemodialysis.  3.  Anemia of chronic disease.   CONDITION ON DISCHARGE:  Fair. Vitals stable.   DISCHARGE MEDICATIONS: 1.  Flomax.  2.  Tamsulosin 0.4 mg p.o. daily.  3.  Sensipar 30 mg daily.  4.  TUMS EX 750 mg p.o. b.i.d. as needed.  5.  Trazodone 50 mg daily.  6.  Tramadol 50 mg, 1 tablet every 6 hours as needed.  7.  Norco 5/325, 1 tablet every 8 hours as needed.  8.  Midodrine 10 mg b.i.d.   [place] as [place].   Follow up with your primary care physician in [place].   Nephrology consultation with Dr. Cherylann RatelLateef.   BRIEF SUMMARY OF HOSPITAL COURSE: Mardene CelesteJerry Wolter is a 69 year old African American gentleman with history of end-stage renal disease who was recently admitted and discharged, came back again with:   1.  Orthostatic hypotension: The patient's metoprolol was discontinued. His blood pressure was closely monitored, and he was started on midodrine.  2.  Hypokalemia, secondary to missed hemodialysis: Potassium improved after hemodialysis. The patient was scheduled to get hemodialysis on Saturday, his routine day, however after speaking with him 2 times since axis was not able to be obtained the patient got upset and decided he did not want hemodialysis and wanted to leave. Nephrologist was aware of it.  3.  Anemia of chronic disease: Hemoglobin is 12.9.  4.  The patient was recommended to continue [place] hemod[place] in [place].  5.  Care management did help with transportation to the train station for patient.   Cardiac enzymes x 3 negative.   Sodium was 138, potassium was 4.6, chloride was 101.   H and H were 12.7 and 37.9.  White count was 10.5.   Hospital stay otherwise remained stable.   THE PATIENT REMAINED A FULL CODE.   Time spent: 40 minutes.     ____________________________ Wylie HailSona A. Allena KatzPatel, MD sap:dm D: 10/15/2012 07:26:47 ET T: 10/15/2012 08:14:26 ET JOB#: [id number]  cc: Lamondre Wesche A. Allena KatzPatel, MD, <Dictator> Willow OraSONA A Otillia Cordone MD ELECTRONICALLY SIGNED 10/23/2012 19:02
